# Patient Record
Sex: Female | Born: 1975 | Race: White | Hispanic: No | Marital: Married | Smoking: Never smoker
Health system: Southern US, Community
[De-identification: ages and names within clinical notes are randomized; demographics above are authoritative.]

## PROBLEM LIST (undated history)

## (undated) DIAGNOSIS — F32A Depression, unspecified: Secondary | ICD-10-CM

## (undated) DIAGNOSIS — F329 Major depressive disorder, single episode, unspecified: Secondary | ICD-10-CM

## (undated) DIAGNOSIS — G243 Spasmodic torticollis: Secondary | ICD-10-CM

## (undated) DIAGNOSIS — F419 Anxiety disorder, unspecified: Secondary | ICD-10-CM

## (undated) DIAGNOSIS — R569 Unspecified convulsions: Secondary | ICD-10-CM

## (undated) DIAGNOSIS — G43909 Migraine, unspecified, not intractable, without status migrainosus: Secondary | ICD-10-CM

## (undated) DIAGNOSIS — G35 Multiple sclerosis: Secondary | ICD-10-CM

## (undated) HISTORY — PX: ABDOMINAL HYSTERECTOMY: SHX81

## (undated) HISTORY — PX: TONSILLECTOMY: SUR1361

## (undated) HISTORY — PX: AUGMENTATION MAMMAPLASTY: SUR837

---

## 2015-12-27 ENCOUNTER — Emergency Department: Admit: 2015-12-28 | Payer: BLUE CROSS/BLUE SHIELD

## 2015-12-27 DIAGNOSIS — R569 Unspecified convulsions: Principal | ICD-10-CM

## 2015-12-27 NOTE — ED Provider Notes (Signed)
[facility] ED  [address]  [place] [address]  Phone: [phone]        Pt Name: Kelsey Blair  MRN: [id number]  Birthdate 1975/10/04  Date of evaluation: 12/27/15      CHIEF COMPLAINT       Chief Complaint   Patient presents with   ??? Seizures     occurred about 45 minutes ago, c/o tingling to lower extremities         HISTORY OF PRESENT ILLNESS  (Location/Symptom, Timing/Onset, Context/Setting, Quality, Duration, Modifying Factors, Severity.)    Kelsey Blair is a 40 y.o. female who presents complaining of seizure.  She relates that she typically has seizures and is well controlled by her home medications.  She relates that she also has a history of multiple sclerosis.  She takes medications for these.  She has several pain medicines at home that she only takes when necessary and has not taken them in some time.  She relates however that this seizure seemed to be longer per her husband and others and she relates that she's been more stressed than usual.  She is here for her daughter's wedding.  She also relates she started chemo for her MS recently.  And it was a new round and a different kind for her as well.  She relates she is now having tingling in her hands and legs.  She also has a headache.  She relates headache after seizure is typical and she usually takes Toradol for that.  She denies any nausea or vomiting.    REVIEW OF SYSTEMS    (2-9 systems for level 4, 10 or more for level 5)     Review of Systems   Constitutional: Negative for activity change, appetite change, chills, diaphoresis and fever.   HENT: Negative for congestion, drooling, ear discharge, ear pain, rhinorrhea and sore throat.    Eyes: Negative for pain, discharge and redness.   Respiratory: Negative for cough, chest tightness, shortness of breath and wheezing.    Cardiovascular: Negative for chest pain and palpitations.   Gastrointestinal: Negative for abdominal pain, constipation, diarrhea and nausea.   Genitourinary: Negative for  decreased urine volume, difficulty urinating, dysuria, flank pain, frequency and hematuria.   Musculoskeletal: Negative for back pain and neck pain.   Skin: Negative for color change, pallor and rash.   Neurological: Positive for seizures and headaches. Negative for dizziness and weakness.        Tingling in hands and legs   Psychiatric/Behavioral: Negative for agitation. The patient is nervous/anxious. The patient is not hyperactive.        PAST MEDICAL HISTORY    has a past medical history of Cervical dystonia; Hypertension; Multiple sclerosis (HCC); and Seizures (HCC).    SURGICAL HISTORY      has a past surgical history that includes Hysterectomy.    CURRENT MEDICATIONS       Previous Medications    BUPROPION (WELLBUTRIN SR) 150 MG EXTENDED RELEASE TABLET    Take 300 mg by mouth 2 times daily     CLONAZEPAM (KLONOPIN) 1 MG TABLET    Take 1 mg by mouth 2 times daily as needed    CYCLOBENZAPRINE (FLEXERIL) 10 MG TABLET    Take 10 mg by mouth 3 times daily as needed for Muscle spasms    DONEPEZIL (ARICEPT) 10 MG TABLET    Take 10 mg by mouth nightly    HYDROCODONE-ACETAMINOPHEN (NORCO) 5-325 MG PER TABLET  Take 1 tablet by mouth every 6 hours as needed for Pain .    HYDROMORPHONE (DILAUDID) 2 MG TABLET    Take 1 mg by mouth every 3 hours as needed for Pain  .    LAMOTRIGINE (LAMICTAL) 150 MG TABLET    Take 75 mg by mouth daily     METHYLPHENIDATE (RITALIN) 10 MG TABLET    Take 10 mg by mouth 2 times daily .    OMEPRAZOLE (PRILOSEC) 20 MG DELAYED RELEASE CAPSULE    Take 30 mg by mouth daily     TAPENTADOL (NUCYNTA) 100 MG TABS    Take 200 mg by mouth every 6 hours as needed for Pain  .    TOPIRAMATE (TOPAMAX) 25 MG TABLET    Take 25 mg by mouth 2 times daily       ALLERGIES     has No Known Allergies.    FAMILY HISTORY     has no family status information on file.    family history is not on file.    SOCIAL HISTORY      reports that she has never smoked. She does not have any smokeless tobacco history on file. She  reports that she does not drink alcohol or use illicit drugs.    PHYSICAL EXAM    (up to 7 for level 4, 8 or more for level 5)   INITIAL VITALS:  height is 5\' 4"  (1.626 m) and weight is 59 kg (130 lb). Her oral temperature is 98.1 ??F (36.7 ??C). Her blood pressure is 112/77 and her pulse is 88. Her respiration is 16 and oxygen saturation is 99%.      Physical Exam   Constitutional: She is oriented to person, place, and time. She appears well-developed and well-nourished.   Patient seems uncomfortable but nontoxic   HENT:   Head: Normocephalic and atraumatic.   Right Ear: External ear normal.   Left Ear: External ear normal.   Nose: Nose normal.   Mouth/Throat: Oropharynx is clear and moist.   Eyes: Conjunctivae and EOM are normal. Pupils are equal, round, and reactive to light. Right eye exhibits no discharge. Left eye exhibits no discharge.   Neck: Normal range of motion. Neck supple. No JVD present.   Cardiovascular: Normal heart sounds.  Tachycardia present.    No murmur heard.  Pulmonary/Chest: Effort normal and breath sounds normal. No respiratory distress. She has no wheezes. She has no rales. She exhibits no tenderness.   Abdominal: Soft. Bowel sounds are normal. She exhibits no distension and no mass. There is no tenderness. There is no rebound and no guarding.   Musculoskeletal: Normal range of motion. She exhibits no edema or tenderness.   Neurological: She is alert and oriented to person, place, and time. No cranial nerve deficit or sensory deficit. She exhibits normal muscle tone. Coordination normal. GCS eye subscore is 4. GCS verbal subscore is 5. GCS motor subscore is 6.   Skin: Skin is warm and dry. No rash noted. She is not diaphoretic.   Psychiatric: She has a normal mood and affect. Her behavior is normal.       DIFFERENTIAL DIAGNOSIS/ MDM:     I did discuss with the patient that I like to go ahead and get some imaging as well as lab work done as this seems different and a breakthrough seizure for  her.  She is comfortable with this plan of care and verbalizes understanding.  Family has not yet arrived.  This could be a breakthrough seizure.  She certainly could have some lab abnormalities.  She seems to be under a lot of stress recently and this could be a part of the problem.    DIAGNOSTIC RESULTS     EKG: All EKG's are interpreted by the Emergency Department Physician who either signs or Co-signs this chart in the absence of a cardiologist.    EKG Interpretation    Interpreted by me    Rhythm: normal sinus   Rate: normal  Axis: normal  Ectopy: none  Conduction: normal  ST Segments: no acute change  T Waves: no acute change  Q Waves: none  Low-voltage QRS    Clinical Impression: Nonspecific changes and abnormal EKG    RADIOLOGY:   Non-plain film images such as CT, Ultrasound and MRI are read by the radiologist. Plain radiographic images are visualized and the radiologist interpretations are reviewed as follows:     Interpretation per the Radiologist below, if available at the time of this note:    Ct Head Wo Contrast    Result Date: 12/27/2015  EXAMINATION: CT OF THE HEAD WITHOUT CONTRAST  12/27/2015 10:35 pm TECHNIQUE: CT of the head was performed without the administration of intravenous contrast. Dose modulation, iterative reconstruction, and/or weight based adjustment of the mA/kV was utilized to reduce the radiation dose to as low as reasonably achievable. COMPARISON: None. HISTORY: ORDERING SYSTEM PROVIDED HISTORY: SEIZURE, NEW, 18-40 YO, NO TRAUMA TECHNOLOGIST PROVIDED HISTORY: Ordering Physician Provided Reason for Exam: seizure Acuity: Acute Type of Exam: Initial Additional signs and symptoms: headache Relevant Medical/Surgical History: Hx Seizures, MS and HTN Tech notes:Pt states seizure tonight, now c/o headache and tingling to hands and legs FINDINGS: BRAIN/VENTRICLES: There is no acute intracranial hemorrhage, mass effect or midline shift.  No abnormal extra-axial fluid collection.  The gray-white  differentiation is maintained without evidence of an acute infarct.  There is no evidence of hydrocephalus. ORBITS: The visualized portion of the orbits demonstrate no acute abnormality. SINUSES: The visualized paranasal sinuses and mastoid air cells demonstrate no acute abnormality. SOFT TISSUES/SKULL:  No acute abnormality of the visualized skull or soft tissues.     No acute intracranial abnormality.     Xr Chest Portable    Result Date: 12/27/2015  EXAMINATION: SINGLE VIEW OF THE CHEST 12/27/2015 10:36 pm COMPARISON: None. HISTORY: ORDERING SYSTEM PROVIDED HISTORY: seizure TECHNOLOGIST PROVIDED HISTORY: Reason for exam:->seizure Ordering Physician Provided Reason for Exam: seizure Acuity: Acute Type of Exam: Initial Additional signs and symptoms: headache Relevant Medical/Surgical History: Seizures, MS and HTN FINDINGS: The mediastinal and cardiac contours are normal.  The lungs are clear.  There is no focal consolidation, pleural effusion or pneumothorax evident.  The bones are unremarkable.     No acute cardiopulmonary process identified.     LABS:  Results for orders placed or performed during the hospital encounter of 12/27/15   CBC Auto Differential   Result Value Ref Range    WBC 4.9 3.5 - 11.0 k/uL    RBC 4.24 4.0 - 5.2 m/uL    Hemoglobin 13.0 12.0 - 16.0 g/dL    Hematocrit 96.0 36 - 46 %    MCV 89.5 80 - 100 fL    MCH 30.6 26 - 34 pg    MCHC 34.2 31 - 37 g/dL    RDW 45.4 09.8 - 11.9 %    Platelets 262 140 - 450 k/uL    MPV 8.0 6.0 - 12.0 fL    Differential  Type NOT REPORTED     WBC Morphology NOT REPORTED     RBC Morphology NOT REPORTED     Platelet Estimate NOT REPORTED     Seg Neutrophils 87 %    Lymphocytes 6 %    Monocytes 6 %    Eosinophils % 1 %    Basophils 0 %    Segs Absolute 4.27 1.8 - 7.7 k/uL    Absolute Lymph # 0.29 (L) 1.0 - 4.8 k/uL    Absolute Mono # 0.29 0.1 - 0.8 k/uL    Absolute Eos # 0.05 0.0 - 0.4 k/uL    Basophils # 0.00 0.0 - 0.2 k/uL    Morphology Normal    Comprehensive Metabolic  Panel   Result Value Ref Range    Glucose 104 (H) 70 - 99 mg/dL    BUN 9 6 - 20 mg/dL    CREATININE 1.610.80 0.960.50 - 0.90 mg/dL    Bun/Cre Ratio NOT REPORTED 9 - 20    Calcium 9.3 8.6 - 10.4 mg/dL    Sodium 045143 409135 - 811144 mmol/L    Potassium 3.0 (L) 3.7 - 5.3 mmol/L    Chloride 103 98 - 107 mmol/L    CO2 20 20 - 31 mmol/L    Anion Gap 20 (H) 9 - 17 mmol/L    Alkaline Phosphatase 55 35 - 104 U/L    ALT 11 5 - 33 U/L    AST 17 <32 U/L    Total Bilirubin 0.30 0.3 - 1.2 mg/dL    Total Protein 7.2 6.4 - 8.3 g/dL    Alb 4.4 3.5 - 5.2 g/dL    Albumin/Globulin Ratio 1.6 1.0 - 2.5    GFR Non-African American >60 >60 mL/min    GFR African American >60 >60 mL/min    GFR Comment          GFR Staging NOT REPORTED    UA W/REFLEX CULTURE   Result Value Ref Range    Color, UA YELLOW YEL    Turbidity UA CLEAR CLEAR    Glucose, Ur NEGATIVE NEG    Bilirubin Urine NEGATIVE NEG    Ketones, Urine NEGATIVE NEG    Specific Gravity, UA 1.020 1.005 - 1.030    Urine Hgb NEGATIVE NEG    pH, UA 6.0 5.0 - 8.0    Protein, UA NEGATIVE NEG    Urobilinogen, Urine Normal NORM    Nitrite, Urine NEGATIVE NEG    Leukocyte Esterase, Urine TRACE (A) NEG    Urinalysis Comments NOT REPORTED    PREGNANCY, URINE   Result Value Ref Range    HCG(Urine) Pregnancy Test CANCELLED BY FLOOR (A) NEG   MAGNESIUM   Result Value Ref Range    Magnesium 2.3 1.6 - 2.6 mg/dL   Microscopic Urinalysis   Result Value Ref Range    -          WBC, UA 5 TO 10 0 - 5 /HPF    RBC, UA 0 TO 2 0 - 2 /HPF    Casts UA NOT REPORTED 0 - 2 /LPF    Crystals UA NOT REPORTED NONE /HPF    Epithelial Cells UA 10 TO 20 0 - 5 /HPF    Renal Epithelial, Urine NOT REPORTED 0 /HPF    Bacteria, UA MODERATE (A) NONE    Mucus, UA 2+ (A) NONE    Trichomonas, UA NOT REPORTED NONE    Amorphous, UA NOT REPORTED NONE    Other  Observations UA Culture ordered based on defined criteria. (A) NREQ    Yeast, UA NOT REPORTED NONE     Labs reviewed.    EMERGENCY DEPARTMENT COURSE:   Vitals:    Vitals:    12/27/15 2150  12/27/15 2220 12/27/15 2310 12/27/15 2316   BP: 116/82 110/77 112/77 112/77   Pulse: 91 94 84 88   Resp: 15 16 15 16    Temp:  97.8 ??F (36.6 ??C)  98.1 ??F (36.7 ??C)   TempSrc:  Oral  Oral   SpO2: 99% 99%  99%   Weight:       Height:         -------------------------  BP: 112/77, Temp: 98.1 ??F (36.7 ??C), Pulse: 88, Resp: 16      RE-EVALUATION:  Improved.      CONSULTS:  none    PROCEDURES:  None    FINAL IMPRESSION      1. Urinary tract infection, site unspecified    2. Breakthrough seizure (HCC)          DISPOSITION/PLAN   DISPOSITION Decision to Discharge    CONDITION ON DISPOSITION:   stable    PATIENT REFERRED TO:  Your family doctor once home.    In 1 week        DISCHARGE MEDICATIONS:  New Prescriptions    SULFAMETHOXAZOLE-TRIMETHOPRIM (BACTRIM DS) 800-160 MG PER TABLET    Take 1 tablet by mouth 2 times daily for 7 days       (Please note that portions of this note were completed with a voice recognition program.  Efforts were made to edit the dictations but occasionally words are mis-transcribed.)    Job Founds,, MD, F.A.C.E.P.  Attending Emergency Medicine Physician        Job Founds, MD  12/27/15 [phone]

## 2015-12-27 NOTE — ED Notes (Addendum)
Pt arrived at ED via EMS for seizure activity. Report from the EMS indicates that the pt just loses consciousness with the seizure. Pt sts she does not have Clonic-Tonic seizures. Pt c/o severe headache, tingling and numbness to both legs. Pt also c/o some aching to her arms also. Skin warm and dry. Respirations non-labored.     Donny PiqueWalter R Julie-Anne Torain, RN  12/27/15 2234       Donny PiqueWalter R Kadeisha Betsch, RN  12/27/15 [phone]

## 2015-12-28 ENCOUNTER — Inpatient Hospital Stay
Admit: 2015-12-28 | Discharge: 2015-12-28 | Disposition: A | Discharge status: Home / Self Care | Payer: BLUE CROSS/BLUE SHIELD | Attending: Emergency Medicine

## 2015-12-28 LAB — MICROSCOPIC URINALYSIS
Epithelial Cells UA: 10 /HPF (ref 0–5)
RBC, UA: 0 /HPF (ref 0–2)
WBC, UA: 5 /HPF (ref 0–5)

## 2015-12-28 LAB — UA W/REFLEX CULTURE
Bilirubin Urine: NEGATIVE
Glucose, Ur: NEGATIVE
Ketones, Urine: NEGATIVE
Nitrite, Urine: NEGATIVE
Protein, UA: NEGATIVE
Specific Gravity, UA: 1.02 (ref 1.005–1.030)
Urine Hgb: NEGATIVE
Urobilinogen, Urine: NORMAL
pH, UA: 6 (ref 5.0–8.0)

## 2015-12-28 LAB — CBC WITH AUTO DIFFERENTIAL
Absolute Eos #: 0.05 10*3/uL (ref 0.0–0.4)
Absolute Lymph #: 0.29 10*3/uL — ABNORMAL LOW (ref 1.0–4.8)
Absolute Mono #: 0.29 10*3/uL (ref 0.1–0.8)
Basophils Absolute: 0 10*3/uL (ref 0.0–0.2)
Basophils: 0 %
Eosinophils %: 1 %
Hematocrit: 38 % (ref 36–46)
Hemoglobin: 13 g/dL (ref 12.0–16.0)
Lymphocytes: 6 %
MCH: 30.6 pg (ref 26–34)
MCHC: 34.2 g/dL (ref 31–37)
MCV: 89.5 fL (ref 80–100)
MPV: 8 fL (ref 6.0–12.0)
Monocytes: 6 %
Morphology: NORMAL
Platelets: 262 10*3/uL (ref 140–450)
RBC: 4.24 m/uL (ref 4.0–5.2)
RDW: 13.9 % (ref 12.5–15.4)
Seg Neutrophils: 87 %
Segs Absolute: 4.27 10*3/uL (ref 1.8–7.7)
WBC: 4.9 10*3/uL (ref 3.5–11.0)

## 2015-12-28 LAB — COMPREHENSIVE METABOLIC PANEL
ALT: 11 U/L (ref 5–33)
AST: 17 U/L (ref <32)
Albumin/Globulin Ratio: 1.6 (ref 1.0–2.5)
Albumin: 4.4 g/dL (ref 3.5–5.2)
Alkaline Phosphatase: 55 U/L (ref 35–104)
Anion Gap: 20 mmol/L — ABNORMAL HIGH (ref 9–17)
BUN: 9 mg/dL (ref 6–20)
CO2: 20 mmol/L (ref 20–31)
Calcium: 9.3 mg/dL (ref 8.6–10.4)
Chloride: 103 mmol/L (ref 98–107)
Creatinine: 0.8 mg/dL (ref 0.50–0.90)
GFR African American: >60 mL/min (ref >60)
GFR Non-African American: >60 mL/min (ref >60)
Glucose: 104 mg/dL — ABNORMAL HIGH (ref 70–99)
Potassium: 3 mmol/L — ABNORMAL LOW (ref 3.7–5.3)
Sodium: 143 mmol/L (ref 135–144)
Total Bilirubin: 0.3 mg/dL (ref 0.3–1.2)
Total Protein: 7.2 g/dL (ref 6.4–8.3)

## 2015-12-28 LAB — EKG 12-LEAD
Atrial Rate: 87 {beats}/min
P Axis: 57 degrees
P-R Interval: 156 ms
Q-T Interval: 374 ms
QRS Duration: 80 ms
QTc Calculation (Bazett): 450 ms
R Axis: -8 degrees
T Axis: 49 degrees
Ventricular Rate: 87 {beats}/min

## 2015-12-28 LAB — PREGNANCY, URINE

## 2015-12-28 LAB — PHOSPHORUS: Phosphorus: 3.4 mg/dL (ref 2.6–4.5)

## 2015-12-28 LAB — MAGNESIUM: Magnesium: 2.3 mg/dL (ref 1.6–2.6)

## 2015-12-28 MED ORDER — SULFAMETHOXAZOLE-TRIMETHOPRIM 800-160 MG PO TABS
800-160 MG | ORAL_TABLET | Freq: Two times a day (BID) | ORAL | 0 refills | Status: AC
Start: 2015-12-28 — End: 2016-01-03

## 2015-12-28 MED ORDER — KETOROLAC TROMETHAMINE 30 MG/ML IJ SOLN
30 MG/ML | Freq: Once | INTRAMUSCULAR | Status: AC
Start: 2015-12-28 — End: 2015-12-27
  Administered 2015-12-28: 02:00:00 30 mg via INTRAVENOUS

## 2015-12-28 MED ORDER — POTASSIUM CHLORIDE CRYS ER 20 MEQ PO TBCR
20 MEQ | Freq: Once | ORAL | Status: AC
Start: 2015-12-28 — End: 2015-12-27
  Administered 2015-12-28: 03:00:00 40 meq via ORAL

## 2015-12-28 MED ORDER — SULFAMETHOXAZOLE-TRIMETHOPRIM 800-160 MG PO TABS
800-160 MG | Freq: Once | ORAL | Status: AC
Start: 2015-12-28 — End: 2015-12-27
  Administered 2015-12-28: 04:00:00 1 via ORAL

## 2015-12-28 MED FILL — KETOROLAC TROMETHAMINE 30 MG/ML IJ SOLN: 30 MG/ML | INTRAMUSCULAR | Qty: 1

## 2015-12-28 MED FILL — KLOR-CON M20 20 MEQ PO TBCR: 20 MEQ | ORAL | Qty: 2

## 2015-12-28 MED FILL — SULFAMETHOXAZOLE-TRIMETHOPRIM 800-160 MG PO TABS: 800-160 MG | ORAL | Qty: 1

## 2015-12-29 LAB — URINE CULTURE CLEAN CATCH: Culture: NO GROWTH

## 2018-10-21 ENCOUNTER — Encounter (HOSPITAL_COMMUNITY): Payer: Self-pay | Admitting: Emergency Medicine

## 2018-10-21 ENCOUNTER — Emergency Department (HOSPITAL_COMMUNITY): Payer: Medicare Other

## 2018-10-21 ENCOUNTER — Other Ambulatory Visit: Payer: Self-pay

## 2018-10-21 ENCOUNTER — Emergency Department (HOSPITAL_COMMUNITY)
Admission: EM | Admit: 2018-10-21 | Discharge: 2018-10-21 | Disposition: A | Discharge status: Home / Self Care | Payer: Medicare Other | Attending: Emergency Medicine | Admitting: Emergency Medicine

## 2018-10-21 DIAGNOSIS — R0602 Shortness of breath: Secondary | ICD-10-CM | POA: Insufficient documentation

## 2018-10-21 DIAGNOSIS — R070 Pain in throat: Secondary | ICD-10-CM | POA: Diagnosis not present

## 2018-10-21 DIAGNOSIS — M791 Myalgia, unspecified site: Secondary | ICD-10-CM | POA: Diagnosis not present

## 2018-10-21 DIAGNOSIS — R51 Headache: Secondary | ICD-10-CM | POA: Diagnosis not present

## 2018-10-21 DIAGNOSIS — J069 Acute upper respiratory infection, unspecified: Secondary | ICD-10-CM | POA: Insufficient documentation

## 2018-10-21 DIAGNOSIS — R05 Cough: Secondary | ICD-10-CM | POA: Diagnosis present

## 2018-10-21 HISTORY — DX: Unspecified convulsions: R56.9

## 2018-10-21 HISTORY — DX: Migraine, unspecified, not intractable, without status migrainosus: G43.909

## 2018-10-21 HISTORY — DX: Multiple sclerosis: G35

## 2018-10-21 HISTORY — DX: Spasmodic torticollis: G24.3

## 2018-10-21 HISTORY — DX: Anxiety disorder, unspecified: F41.9

## 2018-10-21 HISTORY — DX: Major depressive disorder, single episode, unspecified: F32.9

## 2018-10-21 HISTORY — DX: Depression, unspecified: F32.A

## 2018-10-21 LAB — INFLUENZA PANEL BY PCR (TYPE A & B)
Influenza A By PCR: NEGATIVE
Influenza B By PCR: NEGATIVE

## 2018-10-21 MED ORDER — LORAZEPAM 1 MG PO TABS: 1.0000 mg | ORAL_TABLET | Freq: Once | ORAL | Status: DC

## 2018-10-21 MED ORDER — HYDROCOD POLST-CPM POLST ER 10-8 MG/5ML PO SUER
5.0000 mL | Freq: Once | ORAL | Status: AC
  Administered 2018-10-21: 5 mL via ORAL
  Filled 2018-10-21: qty 5

## 2018-10-21 MED ORDER — AMMONIA AROMATIC IN INHA: 0.3000 mL | Freq: Once | RESPIRATORY_TRACT | Status: DC

## 2018-10-21 MED ORDER — SODIUM CHLORIDE 0.9 % IV SOLN: INTRAVENOUS | Status: DC

## 2018-10-21 NOTE — ED Triage Notes (Signed)
Patient complaining of cough, congestion, chills, body aches, and sore throat x 4 days.

## 2018-10-21 NOTE — ED Provider Notes (Signed)
[facility] EMERGENCY DEPARTMENT Provider Note   CSN: [id number] Arrival date & time: 10/21/18  1329    History   Chief Complaint Chief Complaint  Patient presents with  . Cough    HPI Betty Moreno is a 43 y.o. female.     The history is provided by the patient.  Cough  Cough characteristics:  Non-productive Severity:  Moderate Onset quality:  Gradual Duration:  4 days Timing:  Intermittent Progression:  Worsening Chronicity:  New Smoker: no   Context: sick contacts and weather changes   Relieved by:  Nothing Worsened by:  Nothing Associated symptoms: chills, headaches, myalgias, rhinorrhea, shortness of breath, sinus congestion and sore throat   Associated symptoms: no chest pain, no eye discharge and no wheezing   Risk factors comment:  Pt has hx of MS   Past Medical History:  Diagnosis Date  . Anxiety   . Cervical dystonia   . Depression   . Migraines   . Multiple sclerosis (HCC)   . Seizures (HCC)     There are no active problems to display for this patient.   Past Surgical History:  Procedure Laterality Date  . ABDOMINAL HYSTERECTOMY    . TONSILLECTOMY       OB History   No obstetric history on file.      Home Medications    Prior to Admission medications   Not on File    Family History History reviewed. No pertinent family history.  Social History Social History   Tobacco Use  . Smoking status: Never Smoker  . Smokeless tobacco: Never Used  Substance Use Topics  . Alcohol use: Never    Frequency: Never  . Drug use: Never     Allergies   Patient has no known allergies.   Review of Systems Review of Systems  Constitutional: Positive for chills and fatigue. Negative for activity change.       All ROS Neg except as noted in HPI  HENT: Positive for congestion, rhinorrhea and sore throat. Negative for nosebleeds.   Eyes: Negative for photophobia and discharge.  Respiratory: Positive for cough, chest tightness and shortness  of breath. Negative for wheezing.   Cardiovascular: Negative for chest pain and palpitations.  Gastrointestinal: Negative for abdominal pain and blood in stool.  Genitourinary: Negative for dysuria, frequency and hematuria.  Musculoskeletal: Positive for myalgias. Negative for arthralgias, back pain and neck pain.  Skin: Negative.   Neurological: Positive for headaches. Negative for dizziness, seizures and speech difficulty.  Psychiatric/Behavioral: Negative for confusion and hallucinations.     Physical Exam Updated Vital Signs BP (!) 157/94 (BP Location: Right Arm)   Pulse (!) 111   Temp 98.1 F (36.7 C) (Oral)   Resp 16   Ht 5\' 4"  (1.626 m)   Wt 61.2 kg   SpO2 100%   BMI 23.17 kg/m   Physical Exam Vitals signs and nursing note reviewed.  Constitutional:      Appearance: She is well-developed. She is not toxic-appearing.  HENT:     Head: Normocephalic.     Right Ear: Tympanic membrane and external ear normal.     Left Ear: Tympanic membrane and external ear normal.     Nose: Congestion present.  Eyes:     General: Lids are normal.     Pupils: Pupils are equal, round, and reactive to light.  Neck:     Musculoskeletal: Normal range of motion and neck supple.     Vascular: No carotid bruit.  Cardiovascular:     Rate and Rhythm: Normal rate and regular rhythm.     Pulses: Normal pulses.     Heart sounds: Normal heart sounds.  Pulmonary:     Effort: No respiratory distress.     Comments: Course breath sounds. Pt speaks in complete sentences without problem. Abdominal:     General: Bowel sounds are normal.     Palpations: Abdomen is soft.     Tenderness: There is no abdominal tenderness. There is no guarding.  Musculoskeletal: Normal range of motion.  Lymphadenopathy:     Head:     Right side of head: No submandibular adenopathy.     Left side of head: No submandibular adenopathy.     Cervical: No cervical adenopathy.  Skin:    General: Skin is warm and dry.   Neurological:     Mental Status: She is alert and oriented to person, place, and time.     Cranial Nerves: No cranial nerve deficit.     Sensory: No sensory deficit.  Psychiatric:        Speech: Speech normal.      ED Treatments / Results  Labs (all labs ordered are listed, but only abnormal results are displayed) Labs Reviewed - No data to display  EKG None  Radiology No results found.  Procedures Procedures (including critical care time)  Medications Ordered in ED Medications - No data to display   Initial Impression / Assessment and Plan / ED Course  I have reviewed the triage vital signs and the nursing notes.  Pertinent labs & imaging results that were available during my care of the patient were reviewed by me and considered in my medical decision making (see chart for details).          Final Clinical Impressions(s) / ED Diagnoses MDM  Blood pressure is elevated at 157/94, and pulse rate is elevated at 111.  Pulse oximetry is 100% on room air.  Within normal limits by my interpretation.  Patient has a cough, body aches sore throat and chills. We will obtain influenza testing and chest x-ray.  Monitoring pulse oximetry.  Patient running 98 to 100% on room air.  Chest x-ray returns negative for any acute cardiopulmonary issue.  I have discussed the findings with the patient in terms which he understands.  Patient was given Tussionex to use for cough.  Influenza returns negative.  Vital signs rechecked.  While discussing the findings on the influenza as well as the vital signs, the patient complained of feeling as though her heart were racing.  The patient's heart rate was 89 on the monitor.  Pulse oximetry was 98% on room air.  The patient lay down on the stretcher.  She then would not respond to name calling or  shaking her hands or feet.  I checked her blood pressure, and it was found to be stable.  I again attempted to wake the patient but she would not  respond.  When attempting to open her eyes, the patient holds her eye tightly shut. I discussed this with Dr. Deretha Emory, who came to the room and checked the patient. He will assume the care of this patient. Vital signs and pulse ox remain stable   Final diagnoses:  Upper respiratory tract infection, unspecified type    ED Discharge Orders    None       Ivery Quale, PA-C 10/22/18 1443    Vanetta Mulders, MD 11/01/18 [phone]

## 2018-10-21 NOTE — Discharge Instructions (Signed)
Your vital signs are within normal limits.  Your influenza test is negative.  Your chest x-ray is negative.  Please observe self quarantine over the next 14days.  Please return to the emergency department if any difficulty with breathing, pain in your chest, changes in your symptoms, problems, or concerns.

## 2018-10-21 NOTE — ED Notes (Signed)
Pt up and talking, no problems noted, wearing mask, Patient given discharge instruction, verbalized understand. IV removed, band aid applied. Patient ambulatory out of the department.

## 2018-10-21 NOTE — ED Notes (Signed)
Went in to attempt IV access on patient. Explained to patient what plan of care is at this time. Patient states "I don't want an IV and blood work. I'm ok, I just want to go home. I had a small seizure while the doctor was in here. He was standing over there at the computer and I wouldn't answer him. My husband is sitting out in the car and the tests has been negative so I just want to go home. Will you tell the doctor I want to leave?" Advised Dr Deretha Emory.

## 2018-10-21 NOTE — ED Notes (Signed)
Pt refused IV, ready to go home, EDP aware

## 2018-10-21 NOTE — ED Provider Notes (Signed)
Medical screening examination/treatment/procedure(s) were conducted as a shared visit with non-physician practitioner(s) and myself.  I personally evaluated the patient during the encounter.  None  Patient seen by me along with physician assistant.  Patient was ready for discharge patient is got a self quarantine based on her symptoms.  Chest x-ray negative influenza testing negative based on protocol patient not candidate for coronavirus testing.  At the time of discharge patient was unresponsive but it seemed to be sort of a pseudo-unresponsive situation and that the eyelids were tight and difficult to open up and then the eyes would roll down.  Patient now completely alert no symptoms.  Initial labs were ordered but never were drawn so they were canceled patient stable for discharge home.   Betty Mulders, MD 10/21/18 [phone]

## 2020-02-01 IMAGING — CR PORTABLE CHEST - 1 VIEW
1 series · 1 of 1 positions shown · non-contrast
Comparison: None.

CLINICAL DATA: Cough and congestion with fatigue

EXAM:
PORTABLE CHEST 1 VIEW

[portable]
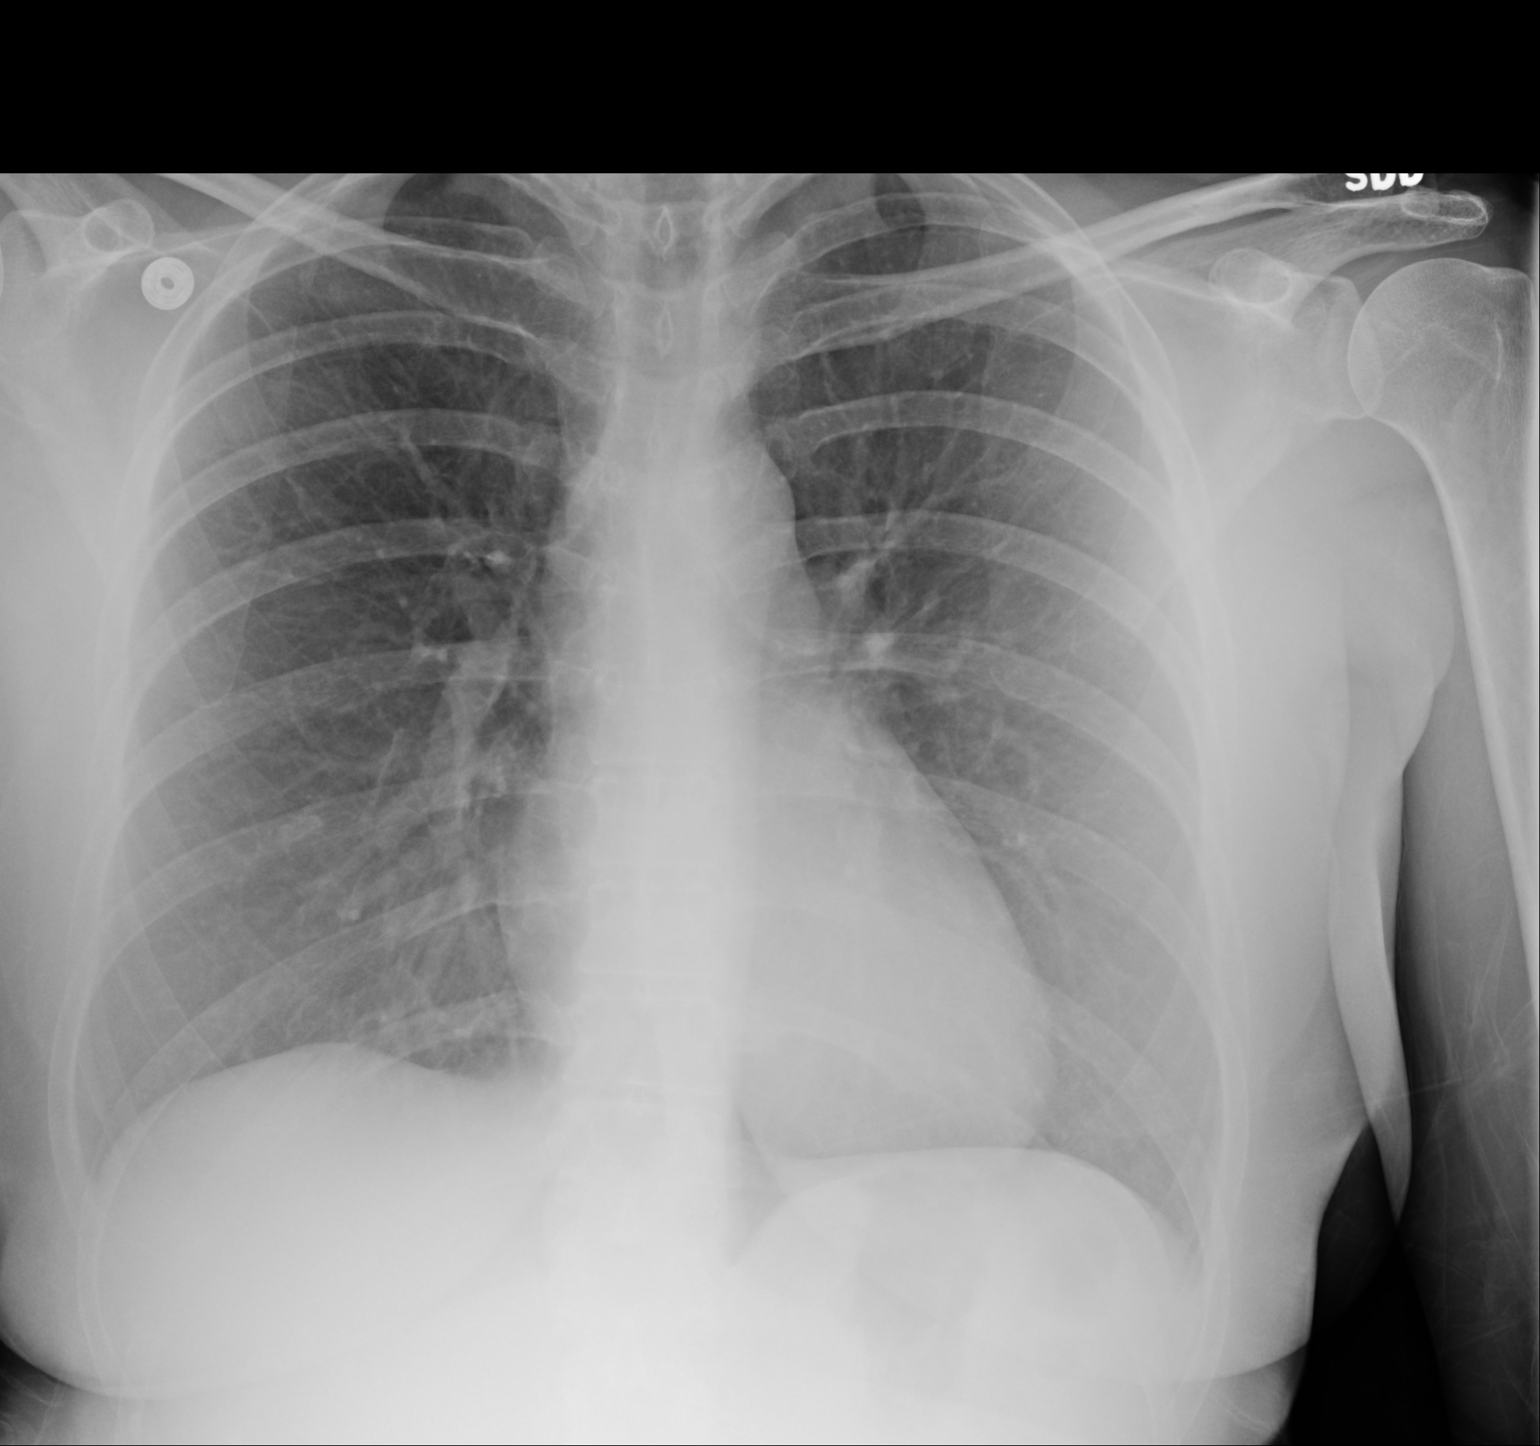

[1 of 1 positions shown; findings below may reference images not displayed]

FINDINGS: Lungs are clear. Heart size and pulmonary vascularity are normal. No
adenopathy. No bone lesions.
IMPRESSION: No edema or consolidation.

## 2020-08-14 ENCOUNTER — Other Ambulatory Visit: Payer: Self-pay

## 2020-08-14 ENCOUNTER — Ambulatory Visit
Admission: EM | Admit: 2020-08-14 | Discharge: 2020-08-14 | Disposition: A | Discharge status: Home / Self Care | Payer: Medicare Other

## 2020-08-15 ENCOUNTER — Ambulatory Visit
Admission: RE | Admit: 2020-08-15 | Discharge: 2020-08-15 | Disposition: A | Discharge status: Home / Self Care | Payer: Medicare Other | Source: Ambulatory Visit | Attending: Emergency Medicine | Admitting: Emergency Medicine

## 2020-08-15 ENCOUNTER — Other Ambulatory Visit: Payer: Self-pay

## 2020-08-15 VITALS — BP 126/89 | HR 109 | Temp 98.4°F | Resp 19 | Ht 64.0 in | Wt 135.0 lb

## 2020-08-15 DIAGNOSIS — Z1152 Encounter for screening for COVID-19: Secondary | ICD-10-CM

## 2020-08-15 DIAGNOSIS — R52 Pain, unspecified: Secondary | ICD-10-CM

## 2020-08-15 DIAGNOSIS — R509 Fever, unspecified: Secondary | ICD-10-CM | POA: Diagnosis not present

## 2020-08-15 DIAGNOSIS — J069 Acute upper respiratory infection, unspecified: Secondary | ICD-10-CM

## 2020-08-15 MED ORDER — BENZONATATE 100 MG PO CAPS: 100.0000 mg | ORAL_CAPSULE | Freq: Three times a day (TID) | ORAL | 0 refills | Status: DC | PRN

## 2020-08-15 MED ORDER — DEXAMETHASONE 4 MG PO TABS: 4.0000 mg | ORAL_TABLET | Freq: Every day | ORAL | 0 refills | Status: AC

## 2020-08-15 MED ORDER — CETIRIZINE HCL 10 MG PO TABS: 10.0000 mg | ORAL_TABLET | Freq: Every day | ORAL | 0 refills | Status: DC

## 2020-08-15 MED ORDER — FLUTICASONE PROPIONATE 50 MCG/ACT NA SUSP: 1.0000 | Freq: Every day | NASAL | 0 refills | Status: DC

## 2020-08-15 NOTE — ED Triage Notes (Signed)
Cough, body aches and fever that started x 2 days ago.

## 2020-08-15 NOTE — Discharge Instructions (Signed)
COVID testing ordered.  It will take between 2-7 days for test results.  Someone will contact you regarding abnormal results.    Get plenty of rest and push fluids Tessalon Perles prescribed for cough Zyrtec for nasal congestion, runny nose, and/or sore throat Flonase for nasal congestion and runny nose Decadron was prescribed Use medications daily for symptom relief Use OTC medications like ibuprofen or tylenol as needed fever or pain Call or go to the ED if you have any new or worsening symptoms such as fever, worsening cough, shortness of breath, chest tightness, chest pain, turning blue, changes in mental status, etc...  

## 2020-08-15 NOTE — ED Provider Notes (Signed)
[facility]   [id number] 08/15/20 Arrival Time: 1047   CC: COVID symptoms  SUBJECTIVE: History from: patient.  Betty Moreno is a 45 y.o. female who presented to the urgent care for complaint of fever, cough and body ache for the past 2 days.  Denies sick exposure to COVID, flu or strep.  Denies recent travel.  Has tried OTC medication without relief.  Denies alleviating or aggravating factors.  Denies previous symptoms in the past.   Denies , fatigue, sinus pain, rhinorrhea, sore throat, SOB, wheezing, chest pain, nausea, changes in bowel or bladder habits.     ROS: As per HPI.  All other pertinent ROS negative.     Past Medical History:  Diagnosis Date  . Anxiety   . Cervical dystonia   . Depression   . Migraines   . Multiple sclerosis (HCC)   . Seizures (HCC)    Past Surgical History:  Procedure Laterality Date  . ABDOMINAL HYSTERECTOMY    . TONSILLECTOMY     No Known Allergies No current facility-administered medications on file prior to encounter.   No current outpatient medications on file prior to encounter.   Social History   Socioeconomic History  . Marital status: Married    Spouse name: Not on file  . Number of children: Not on file  . Years of education: Not on file  . Highest education level: Not on file  Occupational History  . Not on file  Tobacco Use  . Smoking status: Never Smoker  . Smokeless tobacco: Never Used  Substance and Sexual Activity  . Alcohol use: Never  . Drug use: Never  . Sexual activity: Not on file  Other Topics Concern  . Not on file  Social History Narrative  . Not on file   Social Determinants of Health   Financial Resource Strain: Not on file  Food Insecurity: Not on file  Transportation Needs: Not on file  Physical Activity: Not on file  Stress: Not on file  Social Connections: Not on file  Intimate Partner Violence: Not on file   No family history on file.  OBJECTIVE:  Vitals:   08/15/20 1125  08/15/20 1127  BP:  126/89  Pulse:  (!) 109  Resp:  19  Temp:  98.4 F (36.9 C)  TempSrc:  Oral  SpO2:  98%  Weight: 135 lb (61.2 kg)   Height: 5\' 4"  (1.626 m)      General appearance: alert; appears fatigued, but nontoxic; speaking in full sentences and tolerating own secretions HEENT: NCAT; Ears: EACs clear, TMs pearly gray; Eyes: PERRL.  EOM grossly intact. Sinuses: nontender; Nose: nares patent without rhinorrhea, Throat: oropharynx clear, tonsils non erythematous or enlarged, uvula midline  Neck: supple without LAD Lungs: unlabored respirations, symmetrical air entry; cough: moderate; no respiratory distress; CTAB Heart: regular rate and rhythm.  Radial pulses 2+ symmetrical bilaterally Skin: warm and dry Psychological: alert and cooperative; normal mood and affect  LABS:  No results found for this or any previous visit (from the past 24 hour(s)).   ASSESSMENT & PLAN:  1. Encounter for screening for COVID-19   2. Viral URI with cough   3. Fever, unknown origin   4. Body aches     Meds ordered this encounter  Medications  . benzonatate (TESSALON) 100 MG capsule    Sig: Take 1 capsule (100 mg total) by mouth 3 (three) times daily as needed for cough.    Dispense:  30 capsule  Refill:  0  . fluticasone (FLONASE) 50 MCG/ACT nasal spray    Sig: Place 1 spray into both nostrils daily for 14 days.    Dispense:  16 g    Refill:  0  . cetirizine (ZYRTEC ALLERGY) 10 MG tablet    Sig: Take 1 tablet (10 mg total) by mouth daily.    Dispense:  30 tablet    Refill:  0  . dexamethasone (DECADRON) 4 MG tablet    Sig: Take 1 tablet (4 mg total) by mouth daily for 7 days.    Dispense:  7 tablet    Refill:  0    Dischare Instructions  COVID testing ordered.  It will take between 2-7 days for test results.  Someone will contact you regarding abnormal results.     Get plenty of rest and push fluids Tessalon Perles prescribed for cough Zyrtec for nasal congestion, runny  nose, and/or sore throat Flonase for nasal congestion and runny nose Decadron was prescribed Use medications daily for symptom relief Use OTC medications like ibuprofen or tylenol as needed fever or pain Call or go to the ED if you have any new or worsening symptoms such as fever, worsening cough, shortness of breath, chest tightness, chest pain, turning blue, changes in mental status, etc...   Reviewed expectations re: course of current medical issues. Questions answered. Outlined signs and symptoms indicating need for more acute intervention. Patient verbalized understanding. After Visit Summary given.         Durward Parcel, FNP 08/15/20 1154

## 2020-08-16 LAB — COVID-19, FLU A+B NAA
Influenza A, NAA: NOT DETECTED
Influenza B, NAA: NOT DETECTED
SARS-CoV-2, NAA: DETECTED — AB

## 2022-09-29 ENCOUNTER — Encounter (INDEPENDENT_AMBULATORY_CARE_PROVIDER_SITE_OTHER): Payer: Self-pay | Admitting: *Deleted

## 2022-10-12 ENCOUNTER — Telehealth (INDEPENDENT_AMBULATORY_CARE_PROVIDER_SITE_OTHER): Payer: Self-pay | Admitting: Gastroenterology

## 2022-10-12 NOTE — Telephone Encounter (Signed)
Who is your primary care physician: Betty Moreno  Reasons for the colonoscopy: Screening  Have you had a colonoscopy before?  No  Do you have family history of colon cancer? No  Previous colonoscopy with polyps removed? No  Do you have a history colorectal cancer?   No  Are you diabetic? If yes, Type 1 or Type 2?    No  Do you have a prosthetic or mechanical heart valve? No  Do you have a pacemaker/defibrillator?   No  Have you had endocarditis/atrial fibrillation? No  Have you had joint replacement within the last 12 months?  No  Do you tend to be constipated or have to use laxatives? Yes  Do you have any history of drugs or alchohol?  No  Do you use supplemental oxygen?  No  Have you had a stroke or heart attack within the last 6 months? No  Do you take weight loss medication? No  For female patients: have you had a hysterectomy?  Yes                                     are you post menopausal?       No                                            do you still have your menstrual cycle? No      Do you take any blood-thinning medications such as: (aspirin, warfarin, Plavix, Aggrenox)  No  If yes we need the name, milligram, dosage and who is prescribing doctor  Current Outpatient Medications on File Prior to Visit  Medication Sig Dispense Refill   acyclovir (ZOVIRAX) 800 MG tablet Take 800 mg by mouth 2 (two) times daily.     Cholecalciferol 125 MCG (5000 UT) TABS Take 1 tablet by mouth daily.     NUCYNTA ER 200 MG TB12 SMARTSIG:1 Tablet(s) By Mouth Every 12 Hours     omeprazole (PRILOSEC) 40 MG capsule Take 40 mg by mouth daily.     topiramate (TOPAMAX) 100 MG tablet Take 150 mg by mouth 2 (two) times daily.     No current facility-administered medications on file prior to visit.    No Known Allergies   Pharmacy: [facility]   Primary Insurance Name: [employer] number where you can be reached: [phone]

## 2022-10-12 NOTE — Telephone Encounter (Signed)
Any room Thanks 

## 2022-10-13 NOTE — Telephone Encounter (Signed)
Left message to return call 

## 2022-10-15 NOTE — Telephone Encounter (Signed)
Left message to return call.  Letter mailed to patient.

## 2022-10-27 ENCOUNTER — Other Ambulatory Visit: Payer: Self-pay | Admitting: Student

## 2022-10-27 DIAGNOSIS — Z1231 Encounter for screening mammogram for malignant neoplasm of breast: Secondary | ICD-10-CM

## 2022-11-03 ENCOUNTER — Ambulatory Visit
Admission: RE | Admit: 2022-11-03 | Discharge: 2022-11-03 | Disposition: A | Discharge status: Home / Self Care | Payer: Medicare Other | Source: Ambulatory Visit | Attending: Student | Admitting: Student

## 2022-11-03 ENCOUNTER — Other Ambulatory Visit: Payer: Self-pay | Admitting: Student

## 2022-11-03 DIAGNOSIS — Z1231 Encounter for screening mammogram for malignant neoplasm of breast: Secondary | ICD-10-CM

## 2022-11-12 MED ORDER — PEG 3350-KCL-NA BICARB-NACL 420 G PO SOLR: 4000.0000 mL | Freq: Once | ORAL | 0 refills | Status: AC

## 2022-11-12 NOTE — Telephone Encounter (Signed)
Pt called in and schedule TCS for 11/25/22. Instructions mailed to patient. Prep sent to pharmacy.    Per [facility] Notification or Prior Authorization is not required for the requested services You are not required to submit a notification/prior authorization based on the information provided. If you have general questions about the prior authorization requirements, visit [email or web address] > Clinician Resources > Advance and Admission Notification Requirements. The number above acknowledges your notification. Please write this reference number down for future reference. If you would like to request an organization determination, please call us at [phone]. Decision ID #: [id number]

## 2022-11-12 NOTE — Addendum Note (Signed)
Addended by: Marlowe Shores on: 11/12/2022 12:04 PM   Modules accepted: Orders

## 2022-11-18 ENCOUNTER — Encounter (INDEPENDENT_AMBULATORY_CARE_PROVIDER_SITE_OTHER): Payer: Self-pay | Admitting: *Deleted

## 2022-11-18 NOTE — Telephone Encounter (Signed)
Referral completed

## 2022-11-25 ENCOUNTER — Ambulatory Visit (HOSPITAL_BASED_OUTPATIENT_CLINIC_OR_DEPARTMENT_OTHER): Payer: Medicare Other | Admitting: Anesthesiology

## 2022-11-25 ENCOUNTER — Ambulatory Visit (HOSPITAL_COMMUNITY)
Admission: RE | Admit: 2022-11-25 | Discharge: 2022-11-25 | Disposition: A | Discharge status: Home / Self Care | Payer: Medicare Other | Source: Ambulatory Visit | Attending: Gastroenterology | Admitting: Gastroenterology

## 2022-11-25 ENCOUNTER — Encounter (INDEPENDENT_AMBULATORY_CARE_PROVIDER_SITE_OTHER): Payer: Self-pay | Admitting: *Deleted

## 2022-11-25 ENCOUNTER — Ambulatory Visit (HOSPITAL_COMMUNITY): Payer: Medicare Other | Admitting: Anesthesiology

## 2022-11-25 ENCOUNTER — Encounter (HOSPITAL_COMMUNITY)
Admission: RE | Disposition: A | Discharge status: Home / Self Care | Payer: Self-pay | Source: Ambulatory Visit | Attending: Gastroenterology

## 2022-11-25 ENCOUNTER — Other Ambulatory Visit: Payer: Self-pay

## 2022-11-25 ENCOUNTER — Encounter (HOSPITAL_COMMUNITY): Payer: Self-pay | Admitting: Gastroenterology

## 2022-11-25 DIAGNOSIS — K649 Unspecified hemorrhoids: Secondary | ICD-10-CM | POA: Diagnosis not present

## 2022-11-25 DIAGNOSIS — G35 Multiple sclerosis: Secondary | ICD-10-CM | POA: Insufficient documentation

## 2022-11-25 DIAGNOSIS — K648 Other hemorrhoids: Secondary | ICD-10-CM | POA: Insufficient documentation

## 2022-11-25 DIAGNOSIS — R569 Unspecified convulsions: Secondary | ICD-10-CM | POA: Insufficient documentation

## 2022-11-25 DIAGNOSIS — F418 Other specified anxiety disorders: Secondary | ICD-10-CM

## 2022-11-25 DIAGNOSIS — Z1211 Encounter for screening for malignant neoplasm of colon: Secondary | ICD-10-CM

## 2022-11-25 HISTORY — PX: COLONOSCOPY WITH PROPOFOL: SHX5780

## 2022-11-25 LAB — HM COLONOSCOPY

## 2022-11-25 SURGERY — COLONOSCOPY WITH PROPOFOL
Anesthesia: General

## 2022-11-25 MED ORDER — LACTATED RINGERS IV SOLN: INTRAVENOUS | Status: DC

## 2022-11-25 MED ORDER — LIDOCAINE HCL (CARDIAC) PF 100 MG/5ML IV SOSY
PREFILLED_SYRINGE | INTRAVENOUS | Status: DC | PRN
  Administered 2022-11-25: 50 mg via INTRATRACHEAL

## 2022-11-25 MED ORDER — PROPOFOL 10 MG/ML IV BOLUS
INTRAVENOUS | Status: DC | PRN
  Administered 2022-11-25: 100 mg via INTRAVENOUS

## 2022-11-25 MED ORDER — PROPOFOL 500 MG/50ML IV EMUL
INTRAVENOUS | Status: DC | PRN
  Administered 2022-11-25: 200 ug/kg/min via INTRAVENOUS

## 2022-11-25 MED ORDER — STERILE WATER FOR IRRIGATION IR SOLN
Status: DC | PRN
  Administered 2022-11-25: 180 mL

## 2022-11-25 MED ORDER — LACTATED RINGERS IV SOLN: INTRAVENOUS | Status: DC | PRN

## 2022-11-25 NOTE — H&P (Signed)
Betty Moreno is an 47 y.o. female.   Chief Complaint: screening colonoscopy HPI: 47 year old female with past medical history anxiety, depression, multiple sclerosis, seizures, coming for screening colonoscopy. The patient has never had a colonoscopy in the past.  The patient denies having any complaints such as melena, hematochezia, abdominal pain or distention, change in her bowel movement consistency or frequency, no changes in weight recently.  No family history of colorectal cancer.   Past Medical History:  Diagnosis Date   Anxiety    Cervical dystonia    Depression    Migraines    Multiple sclerosis (HCC)    Seizures (HCC)     Past Surgical History:  Procedure Laterality Date   ABDOMINAL HYSTERECTOMY     AUGMENTATION MAMMAPLASTY     TONSILLECTOMY      Family History  Problem Relation Age of Onset   Breast cancer Neg Hx    Social History:  reports that she has never smoked. She has never used smokeless tobacco. She reports that she does not drink alcohol and does not use drugs.  Allergies: No Known Allergies  Medications Prior to Admission  Medication Sig Dispense Refill   acyclovir (ZOVIRAX) 800 MG tablet Take 800 mg by mouth 2 (two) times daily.     amphetamine-dextroamphetamine (ADDERALL) 20 MG tablet Take 20 mg by mouth 2 (two) times daily as needed.     Cholecalciferol 125 MCG (5000 UT) TABS Take 1 tablet by mouth daily.     furosemide (LASIX) 20 MG tablet Take 20 mg by mouth 2 (two) times daily as needed for edema or fluid.     ibuprofen (ADVIL) 200 MG tablet Take 400-800 mg by mouth every 6 (six) hours as needed for moderate pain.     Multiple Vitamins-Minerals (ADULT GUMMY PO) Take 2 capsules by mouth daily.     NUCYNTA ER 200 MG TB12 Take 200 mg by mouth 2 (two) times daily.     omeprazole (PRILOSEC) 40 MG capsule Take 40 mg by mouth daily.     topiramate (TOPAMAX) 100 MG tablet Take 150 mg by mouth 2 (two) times daily.     cyanocobalamin (VITAMIN B12) 1000  MCG/ML injection Inject 1,000 mcg into the muscle every Saturday.     Ocrelizumab (OCREVUS IV) Inject 1 Dose into the vein every 6 (six) months.      No results found for this or any previous visit (from the past 48 hour(s)). No results found.  Review of Systems  All other systems reviewed and are negative.   Blood pressure 124/81, pulse 74, temperature 98.6 F (37 C), temperature source Oral, resp. rate 12, height 5\' 4"  (1.626 m), weight 64.4 kg, SpO2 100 %. Physical Exam  GENERAL: The patient is AO x3, in no acute distress. HEENT: Head is normocephalic and atraumatic. EOMI are intact. Mouth is well hydrated and without lesions. NECK: Supple. No masses LUNGS: Clear to auscultation. No presence of rhonchi/wheezing/rales. Adequate chest expansion HEART: RRR, normal s1 and s2. ABDOMEN: Soft, nontender, no guarding, no peritoneal signs, and nondistended. BS +. No masses. EXTREMITIES: Without any cyanosis, clubbing, rash, lesions or edema. NEUROLOGIC: AOx3, no focal motor deficit. SKIN: no jaundice, no rashes  Assessment/Plan 47 year old female with past medical history anxiety, depression, multiple sclerosis, seizures, coming for screening colonoscopy.  Will proceed with colonoscopy.  Dolores Frame, MD 11/25/2022, 10:41 AM

## 2022-11-25 NOTE — Discharge Instructions (Signed)
You are being discharged to home.  Resume your previous diet.  Your physician has recommended a repeat colonoscopy in 10 years for screening purposes.  

## 2022-11-25 NOTE — Op Note (Signed)
[facility] Patient Name: Betty Moreno Procedure Date: 11/25/2022 10:35 AM MRN: [id number] Date of Birth: 07-30-1975 Attending MD: Katrinka Blazing , , [id number] CSN: [id number] Age: 47 Admit Type: Outpatient Procedure:                Colonoscopy Indications:              Screening for colorectal malignant neoplasm Providers:                Katrinka Blazing, Edrick Kins, RN, Dyann Ruddle Referring MD:              Medicines:                Monitored Anesthesia Care Complications:            No immediate complications. Estimated Blood Loss:     Estimated blood loss: none. Procedure:                Pre-Anesthesia Assessment:                           - Prior to the procedure, a History and Physical                            was performed, and patient medications, allergies                            and sensitivities were reviewed. The patient's                            tolerance of previous anesthesia was reviewed.                           - The risks and benefits of the procedure and the                            sedation options and risks were discussed with the                            patient. All questions were answered and informed                            consent was obtained.                           - ASA Grade Assessment: II - A patient with mild                            systemic disease.                           After obtaining informed consent, the colonoscope                            was passed under direct vision. Throughout the                            procedure, the patient's blood  pressure, pulse, and                            oxygen saturations were monitored continuously. The                            [id number]-[id number] ([id number]) scope was introduced through                            the anus and advanced to the the cecum, identified                            by appendiceal orifice and ileocecal valve. The                            colonoscopy was  performed without difficulty. The                            patient tolerated the procedure well. The quality                            of the bowel preparation was excellent. Scope In: 10:52:48 AM Scope Out: 11:15:54 AM Scope Withdrawal Time: 0 hours 13 minutes 27 seconds  Total Procedure Duration: 0 hours 23 minutes 6 seconds  Findings:      The perianal and digital rectal examinations were normal.      The entire examined colon appeared normal.      Non-bleeding internal hemorrhoids were found during retroflexion. The       hemorrhoids were small. Impression:               - The entire examined colon is normal.                           - Non-bleeding internal hemorrhoids.                           - No specimens collected. Moderate Sedation:      Per Anesthesia Care Recommendation:           - Discharge patient to home (ambulatory).                           - Resume previous diet.                           - Repeat colonoscopy in 10 years for screening                            purposes. Procedure Code(s):        --- Professional ---                           A5409, Colorectal cancer screening; colonoscopy on                            individual not meeting criteria for high risk Diagnosis Code(s):        ---  Professional ---                           Z12.11, Encounter for screening for malignant                            neoplasm of colon                           K64.8, Other hemorrhoids CPT copyright 2022 American Medical Association. All rights reserved. The codes documented in this report are preliminary and upon coder review may  be revised to meet current compliance requirements. Katrinka Blazing, MD Katrinka Blazing,  11/25/2022 11:24:18 AM This report has been signed electronically. Number of Addenda: 0

## 2022-11-25 NOTE — Transfer of Care (Signed)
Immediate Anesthesia Transfer of Care Note  Patient: Betty Moreno  Procedure(s) Performed: COLONOSCOPY WITH PROPOFOL  Patient Location: Endoscopy Unit  Anesthesia Type:General  Level of Consciousness: awake, alert , oriented, and patient cooperative  Airway & Oxygen Therapy: Patient Spontanous Breathing  Post-op Assessment: Report given to RN, Post -op Vital signs reviewed and stable, and Patient moving all extremities  Post vital signs: Reviewed and stable  Last Vitals:  Vitals Value Taken Time  BP    Temp    Pulse    Resp    SpO2      Last Pain:  Vitals:   11/25/22 1048  TempSrc:   PainSc: 0-No pain      Patients Stated Pain Goal: 10 (11/25/22 0926)  Complications: No notable events documented.

## 2022-11-25 NOTE — Anesthesia Preprocedure Evaluation (Signed)
Anesthesia Evaluation  Patient identified by MRN, date of birth, ID band Patient awake    Reviewed: Allergy & Precautions, H&P , NPO status , Patient's Chart, lab work & pertinent test results, reviewed documented beta blocker date and time   Airway Mallampati: II  TM Distance: >3 FB Neck ROM: full    Dental no notable dental hx.    Pulmonary neg pulmonary ROS   Pulmonary exam normal breath sounds clear to auscultation       Cardiovascular Exercise Tolerance: Good negative cardio ROS  Rhythm:regular Rate:Normal     Neuro/Psych  Headaches, Seizures -,  PSYCHIATRIC DISORDERS Anxiety Depression    negative neurological ROS  negative psych ROS   GI/Hepatic negative GI ROS, Neg liver ROS,,,  Endo/Other  negative endocrine ROS    Renal/GU negative Renal ROS  negative genitourinary   Musculoskeletal   Abdominal   Peds  Hematology negative hematology ROS (+)   Anesthesia Other Findings   Reproductive/Obstetrics negative OB ROS                             Anesthesia Physical Anesthesia Plan  ASA: 2  Anesthesia Plan: General   Post-op Pain Management:    Induction:   PONV Risk Score and Plan: Propofol infusion  Airway Management Planned:   Additional Equipment:   Intra-op Plan:   Post-operative Plan:   Informed Consent: I have reviewed the patients History and Physical, chart, labs and discussed the procedure including the risks, benefits and alternatives for the proposed anesthesia with the patient or authorized representative who has indicated his/her understanding and acceptance.     Dental Advisory Given  Plan Discussed with: CRNA  Anesthesia Plan Comments:        Anesthesia Quick Evaluation

## 2022-11-27 NOTE — Anesthesia Postprocedure Evaluation (Signed)
Anesthesia Post Note  Patient: Scientist, product/process development  Procedure(s) Performed: COLONOSCOPY WITH PROPOFOL  Patient location during evaluation: Phase II Anesthesia Type: General Level of consciousness: awake Pain management: pain level controlled Vital Signs Assessment: post-procedure vital signs reviewed and stable Respiratory status: spontaneous breathing and respiratory function stable Cardiovascular status: blood pressure returned to baseline and stable Postop Assessment: no headache and no apparent nausea or vomiting Anesthetic complications: no Comments: Late entry   No notable events documented.   Last Vitals:  Vitals:   11/25/22 0936 11/25/22 1120  BP: 124/81 128/84  Pulse: 74 75  Resp: 12 12  Temp: 37 C 36.5 C  SpO2: 100% 99%    Last Pain:  Vitals:   11/25/22 1120  TempSrc: Oral  PainSc: 0-No pain                 Windell Norfolk

## 2022-12-01 DIAGNOSIS — G243 Spasmodic torticollis: Secondary | ICD-10-CM | POA: Diagnosis not present

## 2022-12-02 ENCOUNTER — Encounter (HOSPITAL_COMMUNITY): Payer: Self-pay | Admitting: Gastroenterology

## 2022-12-22 DIAGNOSIS — G35 Multiple sclerosis: Secondary | ICD-10-CM | POA: Diagnosis not present

## 2022-12-22 DIAGNOSIS — G894 Chronic pain syndrome: Secondary | ICD-10-CM | POA: Diagnosis not present

## 2022-12-22 DIAGNOSIS — M7912 Myalgia of auxiliary muscles, head and neck: Secondary | ICD-10-CM | POA: Diagnosis not present

## 2023-01-23 DIAGNOSIS — R030 Elevated blood-pressure reading, without diagnosis of hypertension: Secondary | ICD-10-CM | POA: Diagnosis not present

## 2023-01-23 DIAGNOSIS — S82831A Other fracture of upper and lower end of right fibula, initial encounter for closed fracture: Secondary | ICD-10-CM | POA: Diagnosis not present

## 2023-01-23 DIAGNOSIS — M25571 Pain in right ankle and joints of right foot: Secondary | ICD-10-CM | POA: Diagnosis not present

## 2023-01-25 DIAGNOSIS — M25571 Pain in right ankle and joints of right foot: Secondary | ICD-10-CM | POA: Diagnosis not present

## 2023-02-04 DIAGNOSIS — G35 Multiple sclerosis: Secondary | ICD-10-CM | POA: Diagnosis not present

## 2023-02-23 DIAGNOSIS — M7912 Myalgia of auxiliary muscles, head and neck: Secondary | ICD-10-CM | POA: Diagnosis not present

## 2023-02-23 DIAGNOSIS — G3184 Mild cognitive impairment, so stated: Secondary | ICD-10-CM | POA: Diagnosis not present

## 2023-02-26 DIAGNOSIS — S8264XA Nondisplaced fracture of lateral malleolus of right fibula, initial encounter for closed fracture: Secondary | ICD-10-CM | POA: Diagnosis not present

## 2023-02-26 DIAGNOSIS — M25571 Pain in right ankle and joints of right foot: Secondary | ICD-10-CM | POA: Diagnosis not present

## 2023-03-23 DIAGNOSIS — G894 Chronic pain syndrome: Secondary | ICD-10-CM | POA: Diagnosis not present

## 2023-04-21 DIAGNOSIS — G894 Chronic pain syndrome: Secondary | ICD-10-CM | POA: Diagnosis not present

## 2023-05-17 DIAGNOSIS — G894 Chronic pain syndrome: Secondary | ICD-10-CM | POA: Diagnosis not present

## 2023-05-25 DIAGNOSIS — G43709 Chronic migraine without aura, not intractable, without status migrainosus: Secondary | ICD-10-CM | POA: Diagnosis not present

## 2023-05-25 DIAGNOSIS — Z79899 Other long term (current) drug therapy: Secondary | ICD-10-CM | POA: Diagnosis not present

## 2023-05-25 DIAGNOSIS — R202 Paresthesia of skin: Secondary | ICD-10-CM | POA: Diagnosis not present

## 2023-05-25 DIAGNOSIS — M7918 Myalgia, other site: Secondary | ICD-10-CM | POA: Diagnosis not present

## 2023-05-25 DIAGNOSIS — R413 Other amnesia: Secondary | ICD-10-CM | POA: Diagnosis not present

## 2023-05-25 DIAGNOSIS — G243 Spasmodic torticollis: Secondary | ICD-10-CM | POA: Diagnosis not present

## 2023-05-25 DIAGNOSIS — R634 Abnormal weight loss: Secondary | ICD-10-CM | POA: Diagnosis not present

## 2023-06-08 DIAGNOSIS — Z23 Encounter for immunization: Secondary | ICD-10-CM | POA: Diagnosis not present

## 2023-06-29 DIAGNOSIS — M50222 Other cervical disc displacement at C5-C6 level: Secondary | ICD-10-CM | POA: Diagnosis not present

## 2023-06-29 DIAGNOSIS — G35 Multiple sclerosis: Secondary | ICD-10-CM | POA: Diagnosis not present

## 2023-07-06 DIAGNOSIS — G243 Spasmodic torticollis: Secondary | ICD-10-CM | POA: Diagnosis not present

## 2023-08-10 DIAGNOSIS — G35 Multiple sclerosis: Secondary | ICD-10-CM | POA: Diagnosis not present

## 2023-08-10 DIAGNOSIS — M5417 Radiculopathy, lumbosacral region: Secondary | ICD-10-CM | POA: Diagnosis not present

## 2023-08-10 DIAGNOSIS — Z79899 Other long term (current) drug therapy: Secondary | ICD-10-CM | POA: Diagnosis not present

## 2023-08-10 DIAGNOSIS — G43709 Chronic migraine without aura, not intractable, without status migrainosus: Secondary | ICD-10-CM | POA: Diagnosis not present

## 2023-09-07 DIAGNOSIS — Z760 Encounter for issue of repeat prescription: Secondary | ICD-10-CM | POA: Diagnosis not present

## 2023-09-07 DIAGNOSIS — Z79899 Other long term (current) drug therapy: Secondary | ICD-10-CM | POA: Diagnosis not present

## 2023-09-20 DIAGNOSIS — D849 Immunodeficiency, unspecified: Secondary | ICD-10-CM | POA: Diagnosis not present

## 2023-09-20 DIAGNOSIS — G35 Multiple sclerosis: Secondary | ICD-10-CM | POA: Diagnosis not present

## 2023-09-20 DIAGNOSIS — K581 Irritable bowel syndrome with constipation: Secondary | ICD-10-CM | POA: Diagnosis not present

## 2023-09-20 DIAGNOSIS — Z299 Encounter for prophylactic measures, unspecified: Secondary | ICD-10-CM | POA: Diagnosis not present

## 2023-09-30 DIAGNOSIS — R350 Frequency of micturition: Secondary | ICD-10-CM | POA: Diagnosis not present

## 2023-09-30 DIAGNOSIS — N390 Urinary tract infection, site not specified: Secondary | ICD-10-CM | POA: Diagnosis not present

## 2023-10-05 DIAGNOSIS — Z79899 Other long term (current) drug therapy: Secondary | ICD-10-CM | POA: Diagnosis not present

## 2023-10-05 DIAGNOSIS — Z760 Encounter for issue of repeat prescription: Secondary | ICD-10-CM | POA: Diagnosis not present

## 2023-11-05 DIAGNOSIS — G35 Multiple sclerosis: Secondary | ICD-10-CM | POA: Diagnosis not present

## 2023-11-05 DIAGNOSIS — R5383 Other fatigue: Secondary | ICD-10-CM | POA: Diagnosis not present

## 2023-11-05 DIAGNOSIS — Z79899 Other long term (current) drug therapy: Secondary | ICD-10-CM | POA: Diagnosis not present

## 2023-11-05 DIAGNOSIS — Z299 Encounter for prophylactic measures, unspecified: Secondary | ICD-10-CM | POA: Diagnosis not present

## 2023-11-05 DIAGNOSIS — Z0000 Encounter for general adult medical examination without abnormal findings: Secondary | ICD-10-CM | POA: Diagnosis not present

## 2023-11-05 DIAGNOSIS — Z7189 Other specified counseling: Secondary | ICD-10-CM | POA: Diagnosis not present

## 2023-11-17 DIAGNOSIS — Z760 Encounter for issue of repeat prescription: Secondary | ICD-10-CM | POA: Diagnosis not present

## 2023-11-17 DIAGNOSIS — G894 Chronic pain syndrome: Secondary | ICD-10-CM | POA: Diagnosis not present

## 2023-11-17 DIAGNOSIS — Z79899 Other long term (current) drug therapy: Secondary | ICD-10-CM | POA: Diagnosis not present

## 2023-12-22 ENCOUNTER — Other Ambulatory Visit: Payer: Self-pay | Admitting: Student

## 2023-12-22 DIAGNOSIS — Z1231 Encounter for screening mammogram for malignant neoplasm of breast: Secondary | ICD-10-CM

## 2023-12-28 ENCOUNTER — Encounter

## 2024-01-11 DIAGNOSIS — Z760 Encounter for issue of repeat prescription: Secondary | ICD-10-CM | POA: Diagnosis not present

## 2024-01-11 DIAGNOSIS — G894 Chronic pain syndrome: Secondary | ICD-10-CM | POA: Diagnosis not present

## 2024-01-11 DIAGNOSIS — Z79899 Other long term (current) drug therapy: Secondary | ICD-10-CM | POA: Diagnosis not present

## 2024-01-24 DIAGNOSIS — G894 Chronic pain syndrome: Secondary | ICD-10-CM | POA: Diagnosis not present

## 2024-02-23 DIAGNOSIS — Z79899 Other long term (current) drug therapy: Secondary | ICD-10-CM | POA: Diagnosis not present

## 2024-02-23 DIAGNOSIS — G894 Chronic pain syndrome: Secondary | ICD-10-CM | POA: Diagnosis not present

## 2024-02-23 DIAGNOSIS — Z760 Encounter for issue of repeat prescription: Secondary | ICD-10-CM | POA: Diagnosis not present

## 2024-02-23 DIAGNOSIS — M7918 Myalgia, other site: Secondary | ICD-10-CM | POA: Diagnosis not present

## 2024-03-21 DIAGNOSIS — Z760 Encounter for issue of repeat prescription: Secondary | ICD-10-CM | POA: Diagnosis not present

## 2024-03-21 DIAGNOSIS — Z79899 Other long term (current) drug therapy: Secondary | ICD-10-CM | POA: Diagnosis not present

## 2024-03-21 DIAGNOSIS — M7918 Myalgia, other site: Secondary | ICD-10-CM | POA: Diagnosis not present

## 2024-05-10 ENCOUNTER — Encounter (INDEPENDENT_AMBULATORY_CARE_PROVIDER_SITE_OTHER): Payer: Self-pay | Admitting: Gastroenterology

## 2024-05-16 DIAGNOSIS — M7912 Myalgia of auxiliary muscles, head and neck: Secondary | ICD-10-CM | POA: Diagnosis not present

## 2024-05-16 DIAGNOSIS — Z760 Encounter for issue of repeat prescription: Secondary | ICD-10-CM | POA: Diagnosis not present

## 2024-05-16 DIAGNOSIS — M7918 Myalgia, other site: Secondary | ICD-10-CM | POA: Diagnosis not present

## 2024-05-16 DIAGNOSIS — Z79899 Other long term (current) drug therapy: Secondary | ICD-10-CM | POA: Diagnosis not present

## 2024-05-16 DIAGNOSIS — G43709 Chronic migraine without aura, not intractable, without status migrainosus: Secondary | ICD-10-CM | POA: Diagnosis not present
